# Patient Record
Sex: Male | Born: 2000 | Race: Black or African American | Hispanic: No | Marital: Single | Smoking: Never smoker
Health system: Southern US, Community
[De-identification: ages and names within clinical notes are randomized; demographics above are authoritative.]

## PROBLEM LIST (undated history)

## (undated) HISTORY — PX: KNEE SURGERY: SHX244

## (undated) HISTORY — PX: OTHER SURGICAL HISTORY: SHX169

---
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
## 2018-03-27 ENCOUNTER — Ambulatory Visit (INDEPENDENT_AMBULATORY_CARE_PROVIDER_SITE_OTHER): Payer: Self-pay | Admitting: Cardiovascular Disease

## 2018-03-27 ENCOUNTER — Encounter: Payer: Self-pay | Admitting: Cardiovascular Disease

## 2018-03-27 VITALS — BP 126/64 | HR 65 | Ht 67.0 in | Wt 147.0 lb

## 2018-03-27 DIAGNOSIS — R0789 Other chest pain: Secondary | ICD-10-CM | POA: Insufficient documentation

## 2018-03-27 DIAGNOSIS — R002 Palpitations: Secondary | ICD-10-CM | POA: Insufficient documentation

## 2018-03-27 DIAGNOSIS — R079 Chest pain, unspecified: Secondary | ICD-10-CM

## 2018-03-27 NOTE — Progress Notes (Signed)
03/27/2018 Darrell Richard   [birth year]  [id number]  Primary Physician Alena Bills, MD Primary Cardiologist: Runell Gess MD Nicholes Calamity, [place]  HPI:  Darrell Richard is a 17 y.o. 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  He has no issues.  He was seen by his primary care doctor, Dr. Hosie Poisson, who referred him here for tachycardia.  He was complaining of some musculoskeletal chest pain thought to be costochondritis.  He does relate that his tachycardia occurred when he was taking Mucinex which has pseudoephedrine in it.   Current Meds  Medication Sig  . acetaminophen (TYLENOL) 325 MG tablet Take 2 tablets (650 mg total) by mouth every 6 (six) hours as needed for moderate pain or headache.  . Chlorphen-Pseudoephed-APAP (CORICIDIN D PO) Take by mouth daily as needed.  Marland Kitchen guaiFENesin (MUCINEX) 600 MG 12 hr tablet Take by mouth 2 (two) times daily.     No Known Allergies  Social History   Socioeconomic History  . Marital status: Single    Spouse name: Not on file  . Number of children: Not on file  . Years of education: Not on file  . Highest education level: Not on file  Occupational History  . Not on file  Social Needs  . Financial resource strain: Not on file  . Food insecurity:    Worry: Not on file    Inability: Not on file  . Transportation needs:    Medical: Not on file    Non-medical: Not on file  Tobacco Use  . Smoking status: Never Smoker  . Smokeless tobacco: Never Used  Substance and Sexual Activity  . Alcohol use: No  . Drug use: Not on file  . Sexual activity: Not on file  Lifestyle  . Physical activity:    Days per week: Not on file    Minutes per session: Not on file  . Stress: Not on file  Relationships  . Social connections:    Talks on phone: Not on file    Gets together: Not on file    Attends religious service: Not on file    Active member  of club or organization: Not on file    Attends meetings of clubs or organizations: Not on file    Relationship status: Not on file  . Intimate partner violence:    Fear of current or ex partner: Not on file    Emotionally abused: Not on file    Physically abused: Not on file    Forced sexual activity: Not on file  Other Topics Concern  . Not on file  Social History Narrative  . Not on file     Review of Systems: General: negative for chills, fever, night sweats or weight changes.  Cardiovascular: negative for chest pain, dyspnea on exertion, edema, orthopnea, palpitations, paroxysmal nocturnal dyspnea or shortness of breath Dermatological: negative for rash Respiratory: negative for cough or wheezing Urologic: negative for hematuria Abdominal: negative for nausea, vomiting, diarrhea, bright red blood per rectum, melena, or hematemesis Neurologic: negative for visual changes, syncope, or dizziness All other systems reviewed and are otherwise negative except as noted above.    Blood pressure 126/64, pulse 65, height  (1.702 m), weight 147 lb (66.7 kg).  General appearance: alert and no distress Neck: no adenopathy, no carotid bruit, no JVD, supple, symmetrical, trachea midline and thyroid not enlarged, symmetric, no tenderness/mass/nodules Lungs: clear  to auscultation bilaterally Heart: regular rate and rhythm, S1, S2 normal, no murmur, click, rub or gallop Extremities: extremities normal, atraumatic, no cyanosis or edema Pulses: 2+ and symmetric Skin: Skin color, texture, turgor normal. No rashes or lesions Neurologic: Alert and oriented X 3, normal strength and tone. Normal symmetric reflexes. Normal coordination and gait  EKG normal sinus rhythm at 65 LVH voltage normal for age.  I personally reviewed this EKG.  ASSESSMENT AND PLAN:   Atypical chest pain Probably costochondritis.  Worse with palpation and with coughing.  Palpitations Complains of palpitations  especially when taking Mucinex.  I suspect this is related to the pseudoephedrine effect.  Otherwise, he does not have palpitations.  No further work-up is required at this time.      Runell Gess MD FACP,FACC,FAHA, [facility] 03/27/2018 1:05 PM

## 2018-03-27 NOTE — Assessment & Plan Note (Signed)
Probably costochondritis.  Worse with palpation and with coughing.

## 2018-03-27 NOTE — Assessment & Plan Note (Signed)
Complains of palpitations especially when taking Mucinex.  I suspect this is related to the pseudoephedrine effect.  Otherwise, he does not have palpitations.  No further work-up is required at this time.

## 2018-03-27 NOTE — Patient Instructions (Signed)
Medication Instructions: Your physician recommends that you continue on your current medications as directed. Please refer to the Current Medication list given to you today.   Follow-Up: Your physician recommends that you schedule a follow-up appointment as needed with Dr. Berry.    

## 2019-07-12 ENCOUNTER — Other Ambulatory Visit: Payer: Self-pay

## 2019-07-12 DIAGNOSIS — Z20822 Contact with and (suspected) exposure to covid-19: Secondary | ICD-10-CM

## 2019-07-13 LAB — NOVEL CORONAVIRUS, NAA: SARS-CoV-2, NAA: NOT DETECTED

## 2020-02-26 ENCOUNTER — Ambulatory Visit: Payer: Medicaid Other

## 2020-02-26 ENCOUNTER — Ambulatory Visit: Payer: Medicaid Other | Attending: Internal Medicine

## 2020-02-26 DIAGNOSIS — Z23 Encounter for immunization: Secondary | ICD-10-CM

## 2020-02-26 NOTE — Progress Notes (Signed)
   [facility] Vacc[facility]  Name:  Nicklos Gaxiola    MRN: [id number] DOB: 08-05-2001  02/26/2020  Mr. Armstrong was observed post Covid-19 immunization for 15 minutes without incident. He was provided with Vaccine Information Sheet and instruction to access the V-Safe system.   Mr. Berenson was instructed to call 911 with any severe reactions post vaccine: Marland Kitchen Difficulty breathing  . Swelling of face and throat  . A fast heartbeat  . A bad rash all over body  . Dizziness and weakness   Immunizations Administered    Name Date Dose VIS Date Route   Pfizer COVID-19 Vaccine 02/26/2020  1:13 PM 0.3 mL 10/24/2019 Intramuscular   Manufacturer: [employer], [employer]   Lot: [id number]   NDC: [address]-7
